# Patient Record
Sex: Female | Born: 1960 | Race: Black or African American | Hispanic: No | Marital: Married | Smoking: Never smoker
Health system: Southern US, Community
[De-identification: ages and names within clinical notes are randomized; demographics above are authoritative.]

## PROBLEM LIST (undated history)

## (undated) HISTORY — PX: ABDOMINAL HYSTERECTOMY: SHX81

---

## 2012-10-23 ENCOUNTER — Ambulatory Visit: Payer: Self-pay | Admitting: Sports Medicine

## 2012-10-23 ENCOUNTER — Ambulatory Visit (INDEPENDENT_AMBULATORY_CARE_PROVIDER_SITE_OTHER): Payer: BC Managed Care – PPO

## 2012-10-23 ENCOUNTER — Ambulatory Visit (INDEPENDENT_AMBULATORY_CARE_PROVIDER_SITE_OTHER): Payer: BC Managed Care – PPO | Admitting: Sports Medicine

## 2012-10-23 ENCOUNTER — Encounter: Payer: Self-pay | Admitting: Sports Medicine

## 2012-10-23 VITALS — BP 111/72 | HR 70 | Wt 215.0 lb

## 2012-10-23 DIAGNOSIS — M5416 Radiculopathy, lumbar region: Secondary | ICD-10-CM | POA: Insufficient documentation

## 2012-10-23 DIAGNOSIS — R209 Unspecified disturbances of skin sensation: Secondary | ICD-10-CM

## 2012-10-23 DIAGNOSIS — R200 Anesthesia of skin: Secondary | ICD-10-CM

## 2012-10-23 DIAGNOSIS — M6740 Ganglion, unspecified site: Secondary | ICD-10-CM

## 2012-10-23 DIAGNOSIS — M5137 Other intervertebral disc degeneration, lumbosacral region: Secondary | ICD-10-CM

## 2012-10-23 LAB — COMPREHENSIVE METABOLIC PANEL WITH GFR
AST: 26 U/L (ref 0–37)
Albumin: 4.4 g/dL (ref 3.5–5.2)
Alkaline Phosphatase: 116 U/L (ref 39–117)
BUN: 14 mg/dL (ref 6–23)
Calcium: 10.3 mg/dL (ref 8.4–10.5)
Chloride: 105 meq/L (ref 96–112)
Glucose, Bld: 84 mg/dL (ref 70–99)
Potassium: 4.2 meq/L (ref 3.5–5.3)
Sodium: 139 meq/L (ref 135–145)
Total Protein: 7.3 g/dL (ref 6.0–8.3)

## 2012-10-23 LAB — HEMOGLOBIN A1C
Hgb A1c MFr Bld: 5.3 % (ref <5.7)
Mean Plasma Glucose: 105 mg/dL (ref <117)

## 2012-10-23 LAB — CBC
HCT: 37.4 % (ref 36.0–46.0)
Hemoglobin: 12.4 g/dL (ref 12.0–15.0)
MCH: 28.4 pg (ref 26.0–34.0)
MCHC: 33.2 g/dL (ref 30.0–36.0)
MCV: 85.8 fL (ref 78.0–100.0)
Platelets: 286 10*3/uL (ref 150–400)
RBC: 4.36 MIL/uL (ref 3.87–5.11)
RDW: 12.9 % (ref 11.5–15.5)
WBC: 4.3 K/uL (ref 4.0–10.5)

## 2012-10-23 LAB — COMPREHENSIVE METABOLIC PANEL
ALT: 26 U/L (ref 0–35)
CO2: 25 mEq/L (ref 19–32)
Creat: 0.63 mg/dL (ref 0.50–1.10)
Total Bilirubin: 0.4 mg/dL (ref 0.3–1.2)

## 2012-10-23 LAB — VITAMIN B12: Vitamin B-12: 1163 pg/mL — ABNORMAL HIGH (ref 211–911)

## 2012-10-23 LAB — TSH: TSH: 1.446 u[IU]/mL (ref 0.350–4.500)

## 2012-10-23 MED ORDER — GABAPENTIN 300 MG PO CAPS: ORAL_CAPSULE | ORAL | Status: DC

## 2012-10-23 NOTE — Assessment & Plan Note (Addendum)
This likely represents peripheral neuropathy versus lumbar spinal stenosis. She also has breakdown of her transverse arch with metatarsalgia which could be contributory. She will come back for custom orthotics. X-ray of her low back. I'm going to check some blood work looking for vitamin deficiencies. I'm also checking hemoglobin A1c. Starting gabapentin at bedtime.  If no better after a month of conservative measures, we do need nerve conduction studies.

## 2012-10-23 NOTE — Assessment & Plan Note (Signed)
Nontender, no intervention needed.

## 2012-10-23 NOTE — Progress Notes (Addendum)
   Subjective:    I'm seeing this patient as a consultation for:  Katharina Caper, FNP  CC: Foot numbness  HPI: Mass on foot: Present for months, there is a small, painless, soft, movable, well-defined mass over the dorsum of the foot between the navicular and the cuneiforms.  Foot numbness: For months, does have a small amount of back pain, but no saddle anesthesia or bowel or bladder dysfunction, she localizes the numbness across all of the toes, but not the foot or leg, worse at the end of the day, no trauma. Symptoms are mild, persistent.  Past medical history, Surgical history, Family history not pertinant except as noted below, Social history, Allergies, and medications have been entered into the medical record, reviewed, and no changes needed.   Review of Systems: No headache, visual changes, nausea, vomiting, diarrhea, constipation, dizziness, abdominal pain, skin rash, fevers, chills, night sweats, weight loss, swollen lymph nodes, body aches, joint swelling, muscle aches, chest pain, shortness of breath, mood changes, visual or auditory hallucinations.   Objective:   General: Well Developed, well nourished, and in no acute distress.  Neuro/Psych: Alert and oriented x3, extra-ocular muscles intact, able to move all 4 extremities, sensation grossly intact. Skin: Warm and dry, no rashes noted.  Respiratory: Not using accessory muscles, speaking in full sentences, trachea midline.  Cardiovascular: Pulses palpable, no extremity edema. Abdomen: Does not appear distended. Right Foot: No visible erythema or swelling. Range of motion is full in all directions. There is a small ganglion cyst at the dorsum of the foot it feels to be between the navicular and the cuneiforms. Strength is 5/5 in all directions. No hallux valgus. No pes cavus or pes planus. No abnormal callus noted. No pain over the navicular prominence, or base of fifth metatarsal. No tenderness to palpation of the  calcaneal insertion of plantar fascia. No pain at the Achilles insertion. No pain over the calcaneal bursa. No pain of the retrocalcaneal bursa. No tenderness to palpation over the tarsals, metatarsals, or phalanges. No hallux rigidus or limitus. No tenderness palpation over interphalangeal joints. No pain with compression of the metatarsal heads. There is breakdown of the transverse arch with clawing of the toes. Neurovascularly intact distally.  Impression and Recommendations:   This case required medical decision making of moderate complexity.

## 2012-10-24 ENCOUNTER — Ambulatory Visit (INDEPENDENT_AMBULATORY_CARE_PROVIDER_SITE_OTHER): Payer: BC Managed Care – PPO | Admitting: Sports Medicine

## 2012-10-24 ENCOUNTER — Encounter: Payer: Self-pay | Admitting: Sports Medicine

## 2012-10-24 VITALS — BP 112/68 | HR 70 | Wt 216.0 lb

## 2012-10-24 DIAGNOSIS — M79609 Pain in unspecified limb: Secondary | ICD-10-CM

## 2012-10-24 DIAGNOSIS — R200 Anesthesia of skin: Secondary | ICD-10-CM

## 2012-10-24 DIAGNOSIS — R209 Unspecified disturbances of skin sensation: Secondary | ICD-10-CM

## 2012-10-24 LAB — VITAMIN D 25 HYDROXY (VIT D DEFICIENCY, FRACTURES): Vit D, 25-Hydroxy: 46 ng/mL (ref 30–89)

## 2012-10-24 NOTE — Assessment & Plan Note (Signed)
Custom orthotics as above, this is likely related to drop of the transverse arches. Return in 2 weeks. If continues to have pain in the metatarsal heads, as well as numbness in the toes I will likely place metatarsal pads.

## 2012-10-24 NOTE — Progress Notes (Signed)
    Patient was fitted for a : standard, cushioned, semi-rigid orthotic. The orthotic was heated and afterward the patient stood on the orthotic blank positioned on the orthotic stand. The patient was positioned in subtalar neutral position and 10 degrees of ankle dorsiflexion in a weight bearing stance. After completion of molding, a stable base was applied to the orthotic blank. The blank was ground to a stable position for weight bearing. Size:10 Base: Blue EVA Additional Posting and Padding: None The patient ambulated these, and they were very comfortable.  I spent 40 minutes with this patient, greater than 50% was face-to-face time counseling regarding the below diagnosis.   

## 2012-11-07 ENCOUNTER — Encounter: Payer: Self-pay | Admitting: Sports Medicine

## 2012-11-07 ENCOUNTER — Ambulatory Visit (INDEPENDENT_AMBULATORY_CARE_PROVIDER_SITE_OTHER): Payer: BC Managed Care – PPO | Admitting: Sports Medicine

## 2012-11-07 VITALS — BP 114/71 | HR 73 | Wt 216.0 lb

## 2012-11-07 DIAGNOSIS — R200 Anesthesia of skin: Secondary | ICD-10-CM

## 2012-11-07 DIAGNOSIS — R209 Unspecified disturbances of skin sensation: Secondary | ICD-10-CM

## 2012-11-07 NOTE — Progress Notes (Signed)
  Subjective:    CC: Followup  HPI: Kimberly Gould comes back for followup of numbness that she's had over the plantar aspect of both feet at the metatarsophalangeal joint distally. I created custom orthotics, this did not improve her symptoms at all. She does have symptoms of metatarsalgia, based on prior x-ray she also has multilevel degenerative disc disease. Symptoms are persistent, stable, symmetric.  Past medical history, Surgical history, Family history not pertinant except as noted below, Social history, Allergies, and medications have been entered into the medical record, reviewed, and no changes needed.   Review of Systems: No fevers, chills, night sweats, weight loss, chest pain, or shortness of breath.   Objective:    General: Well Developed, well nourished, and in no acute distress.  Neuro: Alert and oriented x3, extra-ocular muscles intact, sensation grossly intact.  HEENT: Normocephalic, atraumatic, pupils equal round reactive to light, neck supple, no masses, no lymphadenopathy, thyroid nonpalpable.  Skin: Warm and dry, no rashes. Cardiac: Regular rate and rhythm, no murmurs rubs or gallops, no lower extremity edema.  Respiratory: Clear to auscultation bilaterally. Not using accessory muscles, speaking in full sentences. Bilateral feet: Foot inspection and palpation reveals breakdown of the transverse arch and a drop of MT heads, there is significant tenderness to palpation under the metatarsal heads. Abnormal callous is present under the third metatarsal heads. Hammer toes are present but mild.  Impression and Recommendations:

## 2012-11-07 NOTE — Assessment & Plan Note (Addendum)
Per patient, no better with custom orthotics. Metatarsal pads placed today. Increase gabapentin. Return in one month, if no better we will further pursue the lumbar spine with MRI, and possible nerve conduction studies.  She did drop off a work injury form to be filled out.

## 2012-11-09 ENCOUNTER — Encounter: Payer: Self-pay | Admitting: Sports Medicine

## 2012-11-09 DIAGNOSIS — Z0289 Encounter for other administrative examinations: Secondary | ICD-10-CM

## 2012-11-09 NOTE — Progress Notes (Signed)
Disability forms filled out and placed in my out box. Charge sheet filled out.

## 2012-12-05 ENCOUNTER — Ambulatory Visit: Payer: BC Managed Care – PPO | Admitting: Sports Medicine

## 2018-04-04 ENCOUNTER — Ambulatory Visit (INDEPENDENT_AMBULATORY_CARE_PROVIDER_SITE_OTHER): Payer: BLUE CROSS/BLUE SHIELD

## 2018-04-04 ENCOUNTER — Encounter: Payer: Self-pay | Admitting: Sports Medicine

## 2018-04-04 ENCOUNTER — Ambulatory Visit: Payer: BLUE CROSS/BLUE SHIELD | Admitting: Sports Medicine

## 2018-04-04 DIAGNOSIS — M722 Plantar fascial fibromatosis: Secondary | ICD-10-CM | POA: Diagnosis not present

## 2018-04-04 DIAGNOSIS — M5416 Radiculopathy, lumbar region: Secondary | ICD-10-CM

## 2018-04-04 MED ORDER — GABAPENTIN 100 MG PO CAPS: ORAL_CAPSULE | ORAL | 3 refills | Status: DC

## 2018-04-04 MED ORDER — PREDNISONE 50 MG PO TABS: ORAL_TABLET | ORAL | 0 refills | Status: DC

## 2018-04-04 NOTE — Progress Notes (Signed)
Subjective:    I'm seeing this patient as a consultation for: Marylene Land, PA-C  CC: Heel pain  HPI: This is a pleasant 57 year old female, for the past several months she is had pain on the plantar aspect of her right heel, worse with weightbearing, tender to palpation.  She also has pain radiating from her buttock down to her plantar foot, numbness in all of her toes but worst in the middle 3 toes.  No bowel or bladder dysfunction, saddle numbness, constitutional symptoms.  I reviewed the past medical history, family history, social history, surgical history, and allergies today and no changes were needed.  Please see the problem list section below in epic for further details.  Past Medical History: History reviewed. No pertinent past medical history. Past Surgical History: Past Surgical History:  Procedure Laterality Date  . ABDOMINAL HYSTERECTOMY     Social History: Social History   Socioeconomic History  . Marital status: Married    Spouse name: Not on file  . Number of children: Not on file  . Years of education: Not on file  . Highest education level: Not on file  Occupational History  . Not on file  Social Needs  . Financial resource strain: Not on file  . Food insecurity:    Worry: Not on file    Inability: Not on file  . Transportation needs:    Medical: Not on file    Non-medical: Not on file  Tobacco Use  . Smoking status: Never Smoker  . Smokeless tobacco: Never Used  Substance and Sexual Activity  . Alcohol use: Never    Frequency: Never  . Drug use: Never  . Sexual activity: Not on file  Lifestyle  . Physical activity:    Days per week: Not on file    Minutes per session: Not on file  . Stress: Not on file  Relationships  . Social connections:    Talks on phone: Not on file    Gets together: Not on file    Attends religious service: Not on file    Active member of club or organization: Not on file    Attends meetings of clubs or  organizations: Not on file    Relationship status: Not on file  Other Topics Concern  . Not on file  Social History Narrative  . Not on file   Family History: No family history on file. Allergies: Allergies  Allergen Reactions  . Penicillins    Medications: See med rec.  Review of Systems: No headache, visual changes, nausea, vomiting, diarrhea, constipation, dizziness, abdominal pain, skin rash, fevers, chills, night sweats, weight loss, swollen lymph nodes, body aches, joint swelling, muscle aches, chest pain, shortness of breath, mood changes, visual or auditory hallucinations.   Objective:   General: Well Developed, well nourished, and in no acute distress.  Neuro:  Extra-ocular muscles intact, able to move all 4 extremities, sensation grossly intact.  Deep tendon reflexes tested were normal. Psych: Alert and oriented, mood congruent with affect. ENT:  Ears and nose appear unremarkable.  Hearing grossly normal. Neck: Unremarkable overall appearance, trachea midline.  No visible thyroid enlargement. Eyes: Conjunctivae and lids appear unremarkable.  Pupils equal and round. Skin: Warm and dry, no rashes noted.  Cardiovascular: Pulses palpable, no extremity edema. Back Exam:  Inspection: Unremarkable  Motion: Flexion 45 deg, Extension 45 deg, Side Bending to 45 deg bilaterally,  Rotation to 45 deg bilaterally  SLR laying: Negative  XSLR laying: Negative  Palpable  tenderness: None. FABER: negative. Sensory change: Gross sensation intact to all lumbar and sacral dermatomes.  Reflexes: 2+ at both patellar tendons, 2+ at achilles tendons, Babinski's downgoing.  Strength at foot  Plantar-flexion: 5/5 Dorsi-flexion: 5/5 Eversion: 5/5 Inversion: 5/5  Leg strength  Quad: 5/5 Hamstring: 5/5 Hip flexor: 5/5 Hip abductors: 5/5  Gait unremarkable. Right foot: No visible erythema or swelling. Range of motion is full in all directions. Strength is 5/5 in all directions. No hallux  valgus. No pes cavus or pes planus. No abnormal callus noted. No pain over the navicular prominence, or base of fifth metatarsal. Moderate tenderness to palpation of the calcaneal insertion of plantar fascia. No pain at the Achilles insertion. No pain over the calcaneal bursa. No pain of the retrocalcaneal bursa. No tenderness to palpation over the tarsals, metatarsals, or phalanges. No hallux rigidus or limitus. No tenderness palpation over interphalangeal joints. No pain with compression of the metatarsal heads. Neurovascularly intact distally.  Impression and Recommendations:   This case required medical decision making of moderate complexity.  Plantar fasciitis, right Prednisone, rehab exercises given. Return in a month, injection if no better. At some point we will build her some custom orthotics.  Right lumbar radiculitis Suspect L5 distribution. Updated lumbar spine x-rays. Home rehab exercises given, prednisone. Gabapentin low-dose at bedtime No back pain, only pain from the buttock to the plantar aspect of the foot.  Numbness and tingling in the toes. ___________________________________________ Ihor Austinhomas J. Benjamin Stainhekkekandam, M.D., ABFM., CAQSM. Primary Care and Sports Medicine [facility]  Adjunct Professor of Family Medicine  [employer]

## 2018-04-04 NOTE — Assessment & Plan Note (Signed)
Suspect L5 distribution. Updated lumbar spine x-rays. Home rehab exercises given, prednisone. Gabapentin low-dose at bedtime No back pain, only pain from the buttock to the plantar aspect of the foot.  Numbness and tingling in the toes.

## 2018-04-04 NOTE — Assessment & Plan Note (Signed)
Prednisone, rehab exercises given. Return in a month, injection if no better. At some point we will build her some custom orthotics.

## 2018-05-02 ENCOUNTER — Encounter: Payer: Self-pay | Admitting: Sports Medicine

## 2018-05-02 ENCOUNTER — Ambulatory Visit (INDEPENDENT_AMBULATORY_CARE_PROVIDER_SITE_OTHER): Payer: BLUE CROSS/BLUE SHIELD | Admitting: Sports Medicine

## 2018-05-02 DIAGNOSIS — M722 Plantar fascial fibromatosis: Secondary | ICD-10-CM

## 2018-05-02 NOTE — Assessment & Plan Note (Signed)
Persistent pain in spite of conservative measures. Injection as above. Return for custom molded orthotics. She tells me that when I push on the bottom of her foot it radiates all the way to her hip, she may not actually have much in the line of lumbar radiculitis.

## 2018-05-02 NOTE — Progress Notes (Signed)
Subjective:    CC: Heel pain  HPI: Kimberly Gould returns, we have been treating her conservatively for right plantar fasciitis.  She was endorsing pain that radiated up to her buttock from her foot, we initially suspected a radiculitis.  X-rays were negative, pain continues to be severe, persistent in the bottom aspect of the heel.  She never came for custom orthotics.  I reviewed the past medical history, family history, social history, surgical history, and allergies today and no changes were needed.  Please see the problem list section below in epic for further details.  Past Medical History: No past medical history on file. Past Surgical History: Past Surgical History:  Procedure Laterality Date  . ABDOMINAL HYSTERECTOMY     Social History: Social History   Socioeconomic History  . Marital status: Married    Spouse name: Not on file  . Number of children: Not on file  . Years of education: Not on file  . Highest education level: Not on file  Occupational History  . Not on file  Social Needs  . Financial resource strain: Not on file  . Food insecurity:    Worry: Not on file    Inability: Not on file  . Transportation needs:    Medical: Not on file    Non-medical: Not on file  Tobacco Use  . Smoking status: Never Smoker  . Smokeless tobacco: Never Used  Substance and Sexual Activity  . Alcohol use: Never    Frequency: Never  . Drug use: Never  . Sexual activity: Not on file  Lifestyle  . Physical activity:    Days per week: Not on file    Minutes per session: Not on file  . Stress: Not on file  Relationships  . Social connections:    Talks on phone: Not on file    Gets together: Not on file    Attends religious service: Not on file    Active member of club or organization: Not on file    Attends meetings of clubs or organizations: Not on file    Relationship status: Not on file  Other Topics Concern  . Not on file  Social History Narrative  . Not on file    Family History: No family history on file. Allergies: Allergies  Allergen Reactions  . Penicillins    Medications: See med rec.  Review of Systems: No fevers, chills, night sweats, weight loss, chest pain, or shortness of breath.   Objective:    General: Well Developed, well nourished, and in no acute distress.  Neuro: Alert and oriented x3, extra-ocular muscles intact, sensation grossly intact.  HEENT: Normocephalic, atraumatic, pupils equal round reactive to light, neck supple, no masses, no lymphadenopathy, thyroid nonpalpable.  Skin: Warm and dry, no rashes. Cardiac: Regular rate and rhythm, no murmurs rubs or gallops, no lower extremity edema.  Respiratory: Clear to auscultation bilaterally. Not using accessory muscles, speaking in full sentences. Right foot: No visible erythema or swelling. Range of motion is full in all directions. Strength is 5/5 in all directions. No hallux valgus. No pes cavus or pes planus. No abnormal callus noted. No pain over the navicular prominence, or base of fifth metatarsal. Severe pain at the calcaneal insertion of plantar fascia, palpating here since pain up to her buttock. No pain at the Achilles insertion. No pain over the calcaneal bursa. No pain of the retrocalcaneal bursa. No tenderness to palpation over the tarsals, metatarsals, or phalanges. No hallux rigidus or limitus. No tenderness  palpation over interphalangeal joints. No pain with compression of the metatarsal heads. Neurovascularly intact distally.  Procedure: Real-time Ultrasound Guided Injection of plantar fascia origin on the right Device: GE Logiq E  Verbal informed consent obtained.  Time-out conducted.  Noted no overlying erythema, induration, or other signs of local infection.  Skin prepped in a sterile fashion.  Local anesthesia: Topical Ethyl chloride.  With sterile technique and under real time ultrasound guidance: 1 cc Kenalog 40, 1 cc lidocaine, 1 cc  bupivacaine injected easily. Completed without difficulty  Pain immediately resolved suggesting accurate placement of the medication.  Advised to call if fevers/chills, erythema, induration, drainage, or persistent bleeding.  Images permanently stored and available for review in the ultrasound unit.  Impression: Technically successful ultrasound guided injection.  Impression and Recommendations:    Plantar fasciitis, right Persistent pain in spite of conservative measures. Injection as above. Return for custom molded orthotics. She tells me that when I push on the bottom of her foot it radiates all the way to her hip, she may not actually have much in the line of lumbar radiculitis. ___________________________________________ Ihor Austinhomas J. Benjamin Stainhekkekandam, M.D., ABFM., CAQSM. Primary Care and Sports Medicine [facility]  Adjunct Professor of Family Medicine  [employer]

## 2018-05-12 ENCOUNTER — Ambulatory Visit (INDEPENDENT_AMBULATORY_CARE_PROVIDER_SITE_OTHER): Payer: BLUE CROSS/BLUE SHIELD | Admitting: Sports Medicine

## 2018-05-12 ENCOUNTER — Encounter: Payer: Self-pay | Admitting: Sports Medicine

## 2018-05-12 DIAGNOSIS — M722 Plantar fascial fibromatosis: Secondary | ICD-10-CM

## 2018-05-12 NOTE — Progress Notes (Signed)
    Patient was fitted for a : standard, cushioned, semi-rigid orthotic. The orthotic was heated and afterward the patient stood on the orthotic blank positioned on the orthotic stand. The patient was positioned in subtalar neutral position and 10 degrees of ankle dorsiflexion in a weight bearing stance. After completion of molding, a stable base was applied to the orthotic blank. The blank was ground to a stable position for weight bearing. Size: 10 [facility]: [facility] Additional Posting and Padding: None The patient ambulated these, and they were very comfortable.  I spent 40 minutes with this patient, greater than 50% was face-to-face time counseling regarding the below diagnosis, specifically diagnosis and treatment options as well as the pathophysiology of plantar fasciitis..  ___________________________________________ Ihor Austinhomas J. Benjamin Stainhekkekandam, M.D., ABFM., CAQSM. Primary Care and Sports Medicine [facility]  Adjunct Instructor of Family Medicine  [employer]

## 2018-05-12 NOTE — Assessment & Plan Note (Signed)
Resolved after injection, I emphasized the importance of doing no barefoot walking around the house. Custom orthotics as above. Return in a month if needed.

## 2018-06-06 ENCOUNTER — Ambulatory Visit: Payer: BLUE CROSS/BLUE SHIELD | Admitting: Sports Medicine

## 2018-06-06 ENCOUNTER — Encounter: Payer: Self-pay | Admitting: Sports Medicine

## 2018-06-06 DIAGNOSIS — M722 Plantar fascial fibromatosis: Secondary | ICD-10-CM

## 2018-06-06 NOTE — Progress Notes (Signed)
  Subjective:    CC: Plantar fasciitis  HPI: Kimberly Gould returns, we have built custom orthotics, done an injection, she has avoided barefoot walking, she has done rehab exercises and returns completely pain-free.  I reviewed the past medical history, family history, social history, surgical history, and allergies today and no changes were needed.  Please see the problem list section below in epic for further details.  Past Medical History: No past medical history on file. Past Surgical History: Past Surgical History:  Procedure Laterality Date  . ABDOMINAL HYSTERECTOMY     Social History: Social History   Socioeconomic History  . Marital status: Married    Spouse name: Not on file  . Number of children: Not on file  . Years of education: Not on file  . Highest education level: Not on file  Occupational History  . Not on file  Social Needs  . Financial resource strain: Not on file  . Food insecurity:    Worry: Not on file    Inability: Not on file  . Transportation needs:    Medical: Not on file    Non-medical: Not on file  Tobacco Use  . Smoking status: Never Smoker  . Smokeless tobacco: Never Used  Substance and Sexual Activity  . Alcohol use: Never    Frequency: Never  . Drug use: Never  . Sexual activity: Not on file  Lifestyle  . Physical activity:    Days per week: Not on file    Minutes per session: Not on file  . Stress: Not on file  Relationships  . Social connections:    Talks on phone: Not on file    Gets together: Not on file    Attends religious service: Not on file    Active member of club or organization: Not on file    Attends meetings of clubs or organizations: Not on file    Relationship status: Not on file  Other Topics Concern  . Not on file  Social History Narrative  . Not on file   Family History: No family history on file. Allergies: Allergies  Allergen Reactions  . Penicillins    Medications: See med rec.  Review of Systems: No  fevers, chills, night sweats, weight loss, chest pain, or shortness of breath.   Objective:    General: Well Developed, well nourished, and in no acute distress.  Neuro: Alert and oriented x3, extra-ocular muscles intact, sensation grossly intact.  HEENT: Normocephalic, atraumatic, pupils equal round reactive to light, neck supple, no masses, no lymphadenopathy, thyroid nonpalpable.  Skin: Warm and dry, no rashes. Cardiac: Regular rate and rhythm, no murmurs rubs or gallops, no lower extremity edema.  Respiratory: Clear to auscultation bilaterally. Not using accessory muscles, speaking in full sentences.  Impression and Recommendations:    Plantar fasciitis, right Symptoms now completely resolved with custom orthotics, injection, and avoidance of barefoot walking. Return as needed. ___________________________________________ Ihor Austin. Benjamin Stain, M.D., ABFM., CAQSM. Primary Care and Sports Medicine [facility]  Adjunct Professor of Family Medicine  [employer]

## 2018-06-06 NOTE — Assessment & Plan Note (Signed)
Symptoms now completely resolved with custom orthotics, injection, and avoidance of barefoot walking. Return as needed.

## 2019-04-10 ENCOUNTER — Other Ambulatory Visit: Payer: Self-pay | Admitting: Family Medicine

## 2019-04-10 ENCOUNTER — Other Ambulatory Visit: Payer: BLUE CROSS/BLUE SHIELD

## 2019-04-10 DIAGNOSIS — R1031 Right lower quadrant pain: Secondary | ICD-10-CM

## 2019-04-11 ENCOUNTER — Other Ambulatory Visit: Payer: BC Managed Care – PPO

## 2019-04-12 ENCOUNTER — Other Ambulatory Visit: Payer: Self-pay | Admitting: Family Medicine

## 2019-04-12 DIAGNOSIS — R1084 Generalized abdominal pain: Secondary | ICD-10-CM

## 2019-04-17 ENCOUNTER — Other Ambulatory Visit: Payer: BC Managed Care – PPO

## 2019-05-01 ENCOUNTER — Ambulatory Visit
Admission: RE | Admit: 2019-05-01 | Discharge: 2019-05-01 | Disposition: A | Discharge status: Home / Self Care | Payer: BC Managed Care – PPO | Source: Ambulatory Visit | Attending: Family Medicine | Admitting: Family Medicine

## 2019-05-01 DIAGNOSIS — R1031 Right lower quadrant pain: Secondary | ICD-10-CM

## 2019-05-01 MED ORDER — IOPAMIDOL (ISOVUE-300) INJECTION 61%
125.0000 mL | Freq: Once | INTRAVENOUS | Status: AC | PRN
  Administered 2019-05-01: 125 mL via INTRAVENOUS

## 2021-11-06 IMAGING — CT CT ABD-PELV W/ CM
2 of 5 series · 13 of 46 positions shown, 15 images · IV contrast (iopamidol)
Comparison: None.

CLINICAL DATA: Right lower quadrant abdominal pain x3 weeks.
History of hysterectomy.

EXAM:
CT ABDOMEN AND PELVIS WITH CONTRAST
TECHNIQUE: Multidetector CT imaging of the abdomen and pelvis was performed
using the standard protocol following bolus administration of
intravenous contrast.
CONTRAST:  125mL [id number] IOPAMIDOL ([id number]) INJECTION 61%

[Series 2: abd pelvis 5.00 br40 s3 axial · axial · 0.66mm/px · z∈[+1265,+1705]mm · 10 of 100 slices shown, 12 images]
[im 6/100  soft-tissue]
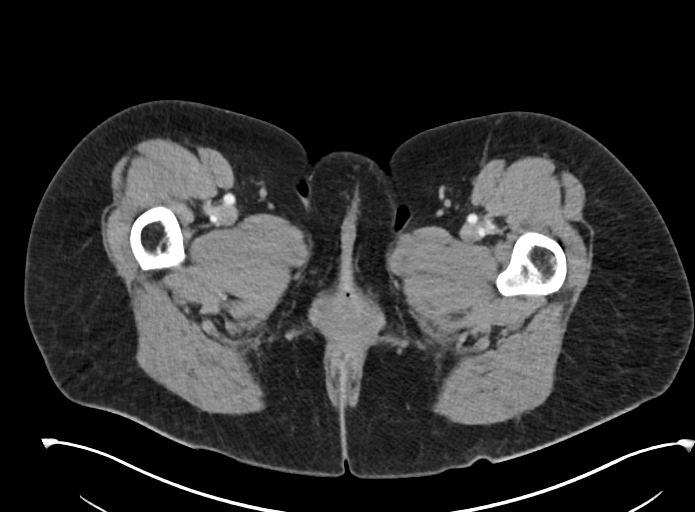
[im 6/100  bone]
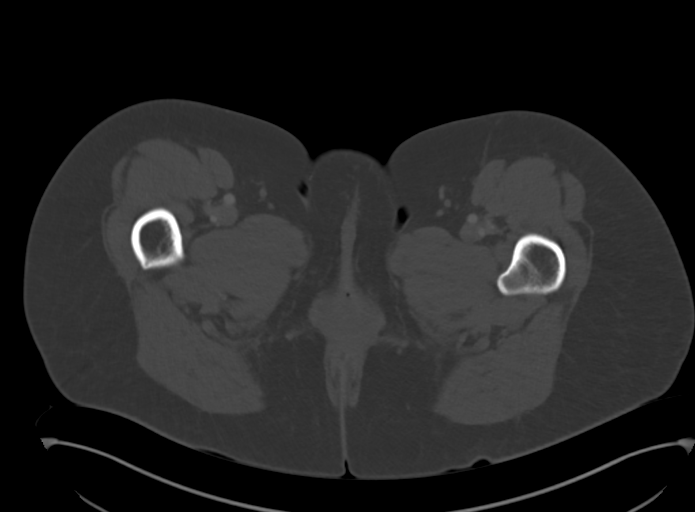
[im 17/100  soft-tissue]
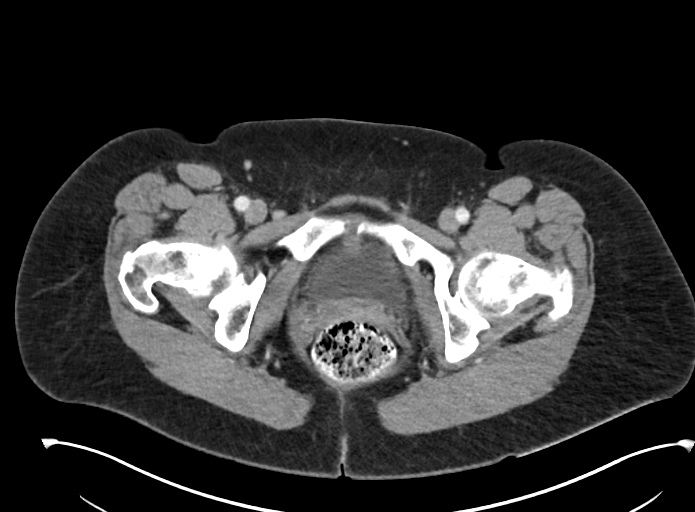
[im 28/100  soft-tissue]
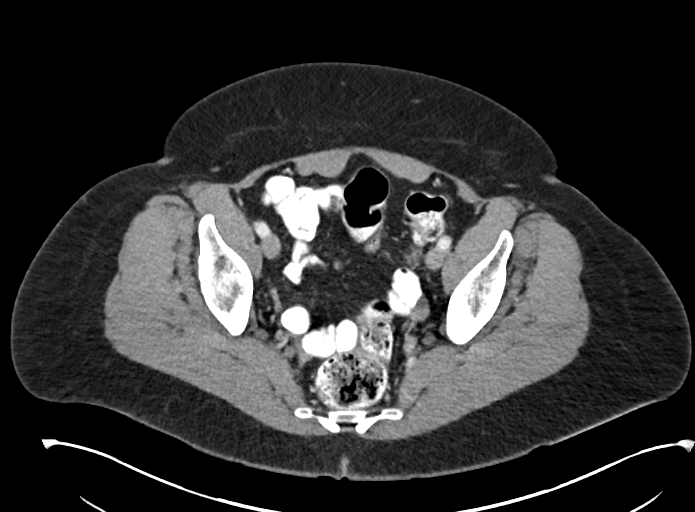
[im 34/100  soft-tissue]
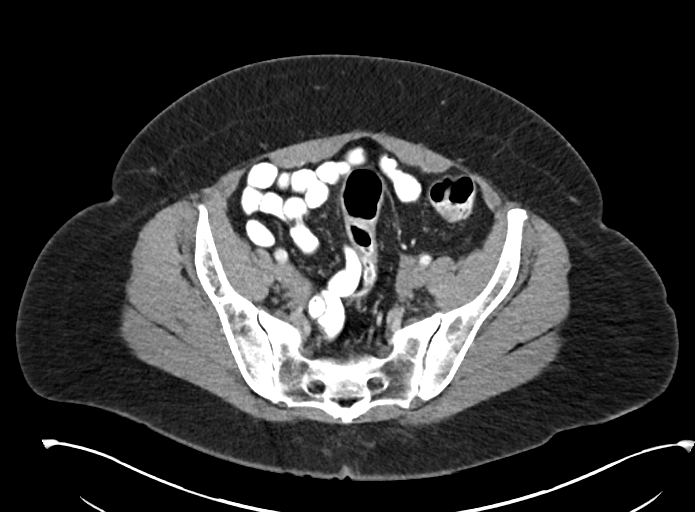
[im 45/100  soft-tissue]
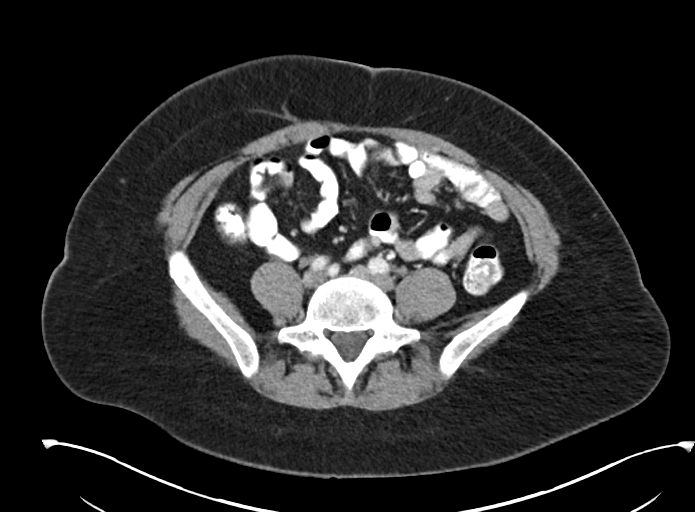
[im 56/100  soft-tissue]
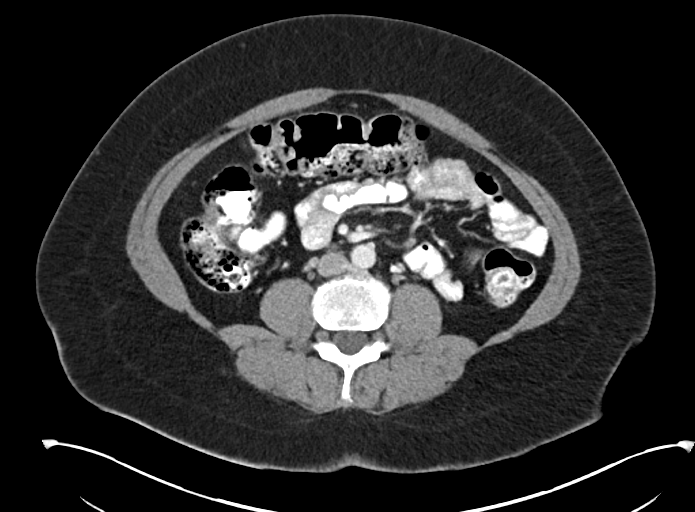
[im 67/100  soft-tissue]
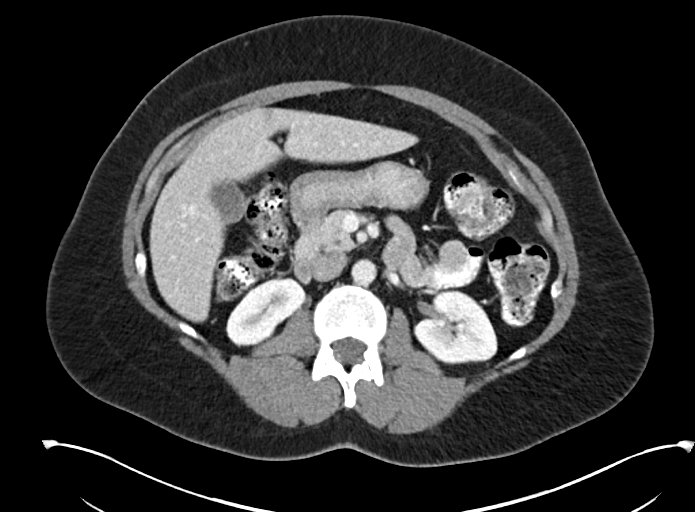
[im 72/100  soft-tissue]
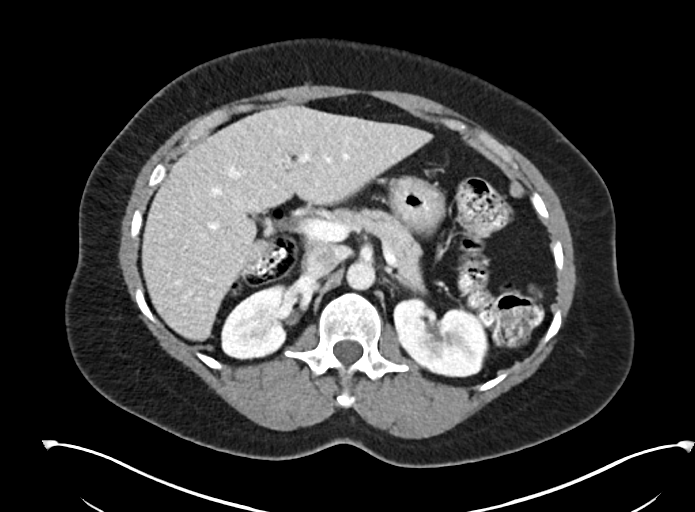
[im 83/100  soft-tissue]
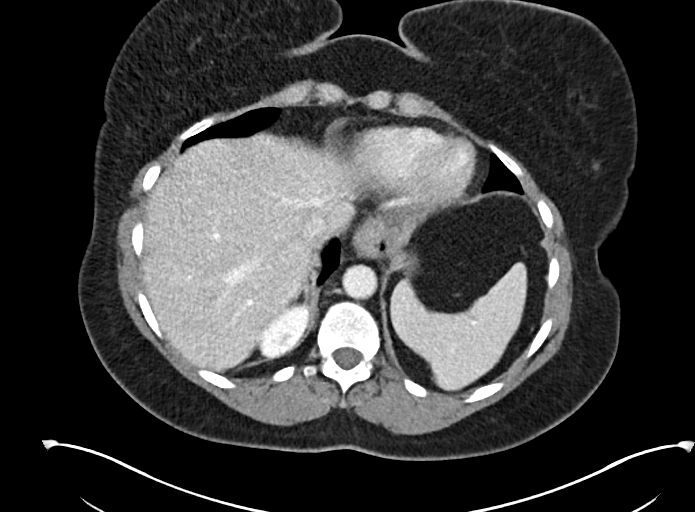
[im 83/100  bone]
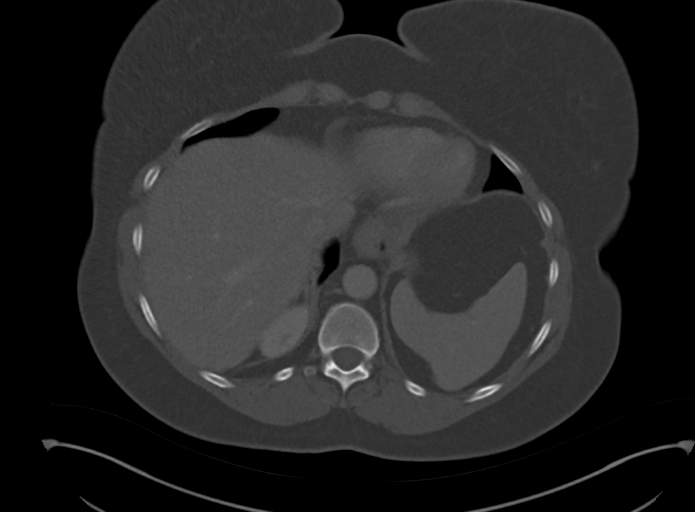
[im 94/100  soft-tissue]
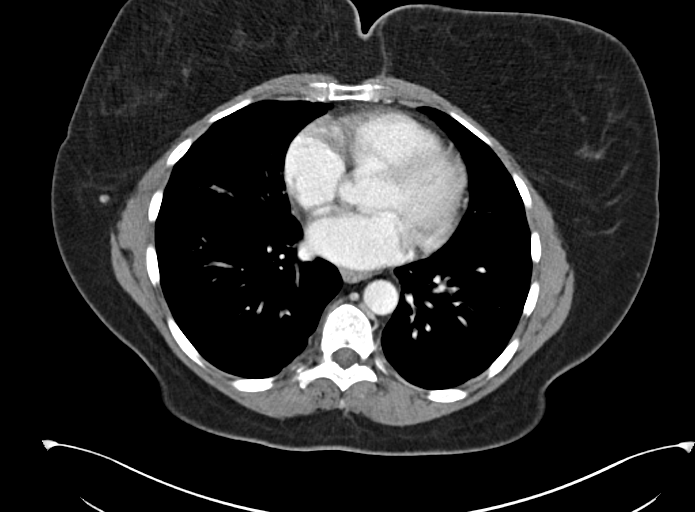

[Series 6: abd pelvis 2.00 br40 s3 cor · coronal · 0.90mm/px · 3 of 170 slices shown]
[im 57/170  soft-tissue]
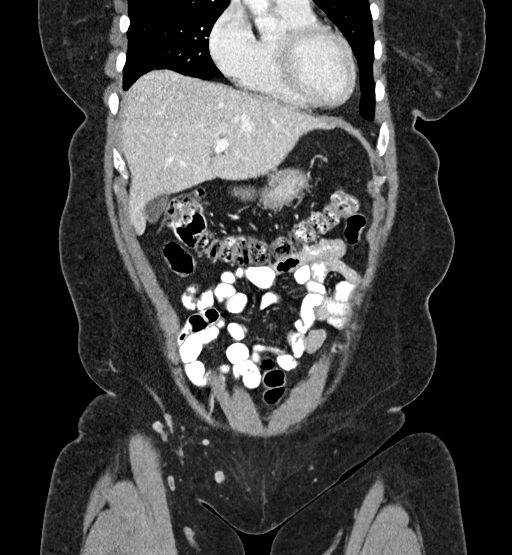
[im 76/170  soft-tissue]
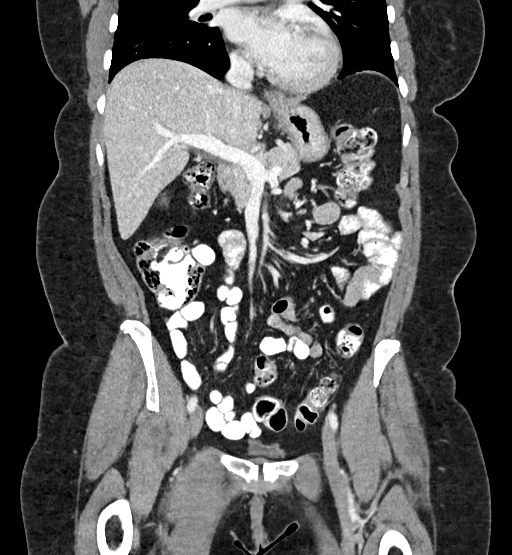
[im 94/170  soft-tissue]
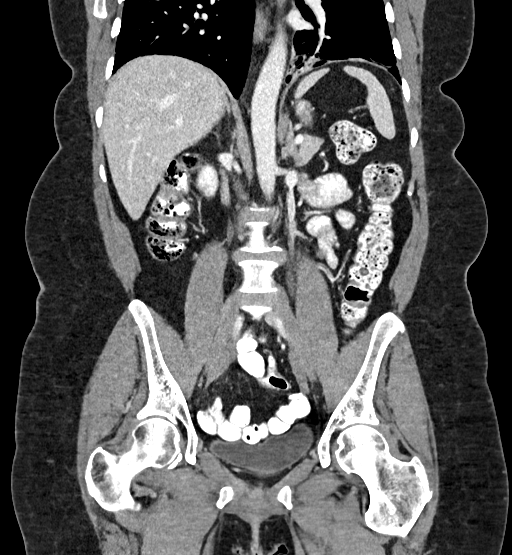

[13 of 46 positions shown; findings below may reference images not displayed]

FINDINGS: Lower chest: There is scattered ground-glass airspace opacities with
more focal consolidation at the left lung base.The heart size is
normal.

Hepatobiliary: There is likely underlying hepatic steatosis. Normal
gallbladder.There is no biliary ductal dilation.

Pancreas: Normal contours without ductal dilatation. No
peripancreatic fluid collection.

Spleen: No splenic laceration or hematoma.

Adrenals/Urinary Tract:

--Adrenal glands: No adrenal hemorrhage.

--Right kidney/ureter: No hydronephrosis or perinephric hematoma.

--Left kidney/ureter: No hydronephrosis or perinephric hematoma.

--Urinary bladder: Unremarkable.

Stomach/Bowel:

--Stomach/Duodenum: No hiatal hernia or other gastric abnormality.
Normal duodenal course and caliber.

--Small bowel: No dilatation or inflammation.

--Colon: No focal abnormality.

--Appendix: Normal.

Vascular/Lymphatic: Normal course and caliber of the major abdominal
vessels.

--No retroperitoneal lymphadenopathy.

--No mesenteric lymphadenopathy.

--No pelvic or inguinal lymphadenopathy.

Reproductive: Status post hysterectomy. No adnexal mass.

Other: No ascites or free air. There is a fat containing
periumbilical hernia.

Musculoskeletal. No acute displaced fractures.
IMPRESSION: 1. No acute intra-abdominal abnormality detected. No findings to
explain the patient's right lower quadrant pain. The appendix is
normal.
2. Incidentally noted multifocal airspace opacities at the lung
bases concerning for multifocal pneumonia (viral or bacterial).

These results will be called to the ordering clinician or
representative by the Radiologist Assistant, and communication
documented in the PACS or zVision Dashboard.

## 2023-02-17 ENCOUNTER — Ambulatory Visit: Payer: BC Managed Care – PPO

## 2023-02-17 ENCOUNTER — Ambulatory Visit: Payer: BC Managed Care – PPO | Admitting: Sports Medicine

## 2023-02-17 ENCOUNTER — Encounter: Payer: Self-pay | Admitting: Sports Medicine

## 2023-02-17 DIAGNOSIS — M19032 Primary osteoarthritis, left wrist: Secondary | ICD-10-CM

## 2023-02-17 DIAGNOSIS — M25542 Pain in joints of left hand: Secondary | ICD-10-CM

## 2023-02-17 MED ORDER — MELOXICAM 15 MG PO TABS: ORAL_TABLET | ORAL | 3 refills | Status: DC

## 2023-02-17 NOTE — Assessment & Plan Note (Signed)
This is a very pleasant 62 year old female with chronic pain left wrist, she localizes the pain at the dorsal radiocarpal joint. She does not have any pain at the thumb basal joint. She has a negative Finkelstein sign. She does have mild fusiform swelling compared to the contralateral side. Would like baseline x-rays, we will start meloxicam, home physical therapy, she can return to see me in about 4 to 6 weeks, we will do an injection if not better +/- MRI.

## 2023-02-17 NOTE — Progress Notes (Signed)
    Procedures performed today:    None.  Independent interpretation of notes and tests performed by another provider:   None.  Brief History, Exam, Impression, and Recommendations:    Primary osteoarthritis, left wrist This is a very pleasant 62 year old female with chronic pain left wrist, she localizes the pain at the dorsal radiocarpal joint. She does not have any pain at the thumb basal joint. She has a negative Finkelstein sign. She does have mild fusiform swelling compared to the contralateral side. Would like baseline x-rays, we will start meloxicam, home physical therapy, she can return to see me in about 4 to 6 weeks, we will do an injection if not better +/- MRI.  Chronic process with exacerbation and pharmacologic intervention  ____________________________________________ Ihor Austin. Benjamin Stain, M.D., ABFM., CAQSM., AME. Primary Care and Sports Medicine [facility]  Adjunct Professor of Family Medicine  [place] [employer] [facility] [employer]  Restaurant manager, fast food

## 2023-02-23 ENCOUNTER — Ambulatory Visit: Payer: BC Managed Care – PPO | Admitting: Sports Medicine

## 2023-04-05 ENCOUNTER — Ambulatory Visit: Payer: BC Managed Care – PPO | Admitting: Sports Medicine

## 2023-04-05 ENCOUNTER — Encounter: Payer: Self-pay | Admitting: Sports Medicine

## 2023-04-05 DIAGNOSIS — M19032 Primary osteoarthritis, left wrist: Secondary | ICD-10-CM | POA: Diagnosis not present

## 2023-04-05 MED ORDER — DICLOFENAC SODIUM 1 % EX GEL: 2.0000 g | Freq: Four times a day (QID) | CUTANEOUS | 11 refills | Status: DC

## 2023-04-05 NOTE — Assessment & Plan Note (Addendum)
Chronic left wrist pain, localized historically at the dorsal radiocarpal joint, all other special exam maneuvers were normal. X-rays did show some diffuse osteoarthritis. Meloxicam has helped, not interested in injections or narcotics. She is doing her home PT, adding topical Voltaren, she can return to see me as needed.

## 2023-04-05 NOTE — Progress Notes (Signed)
    Procedures performed today:    None.  Independent interpretation of notes and tests performed by another provider:   None.  Brief History, Exam, Impression, and Recommendations:    Primary osteoarthritis, left wrist Chronic left wrist pain, localized historically at the dorsal radiocarpal joint, all other special exam maneuvers were normal. X-rays did show some diffuse osteoarthritis. Meloxicam has helped, not interested in injections or narcotics. She is doing her home PT, adding topical Voltaren, she can return to see me as needed.  Chronic process with exacerbation and pharmacologic intervention  ____________________________________________ Ihor Austin. Benjamin Stain, M.D., ABFM., CAQSM., AME. Primary Care and Sports Medicine [facility]  Adjunct Professor of Family Medicine  [place] [employer] [facility] [employer]  Restaurant manager, fast food

## 2023-07-16 ENCOUNTER — Other Ambulatory Visit: Payer: Self-pay | Admitting: Sports Medicine

## 2023-07-16 DIAGNOSIS — M19032 Primary osteoarthritis, left wrist: Secondary | ICD-10-CM

## 2023-07-26 ENCOUNTER — Ambulatory Visit (INDEPENDENT_AMBULATORY_CARE_PROVIDER_SITE_OTHER): Admitting: Sports Medicine

## 2023-07-26 ENCOUNTER — Ambulatory Visit

## 2023-07-26 ENCOUNTER — Encounter: Payer: Self-pay | Admitting: Sports Medicine

## 2023-07-26 DIAGNOSIS — M722 Plantar fascial fibromatosis: Secondary | ICD-10-CM | POA: Diagnosis not present

## 2023-07-26 DIAGNOSIS — M1712 Unilateral primary osteoarthritis, left knee: Secondary | ICD-10-CM | POA: Insufficient documentation

## 2023-07-26 DIAGNOSIS — M19032 Primary osteoarthritis, left wrist: Secondary | ICD-10-CM | POA: Diagnosis not present

## 2023-07-26 DIAGNOSIS — M25562 Pain in left knee: Secondary | ICD-10-CM | POA: Diagnosis not present

## 2023-07-26 DIAGNOSIS — Z0189 Encounter for other specified special examinations: Secondary | ICD-10-CM

## 2023-07-26 MED ORDER — DICLOFENAC SODIUM 1 % EX GEL: 2.0000 g | Freq: Four times a day (QID) | CUTANEOUS | 11 refills | Status: AC

## 2023-07-26 MED ORDER — MELOXICAM 15 MG PO TABS: ORAL_TABLET | ORAL | 3 refills | Status: DC

## 2023-07-26 NOTE — Progress Notes (Signed)
    Procedures performed today:    None.  Independent interpretation of notes and tests performed by another provider:   None.  Brief History, Exam, Impression, and Recommendations:    Left knee pain This pleasant 63 year old female 2 weeks ago excellently bumped into a metal cart, it impacted the anterior aspect of her left knee, she had immediate pain, swelling. Since then she has continued to have pain that she localizes anteriorly, she has moderate swelling, no mechanical symptoms. On exam she has a palpable effusion, no discrete areas of tenderness to palpation, good motion, good strength, ligaments are stable. Suspect this contusion resulted in exacerbation of her underlying arthritis, we will start conservatively with x-rays, meloxicam, physical therapy, if not better in 4 to 6 weeks we will consider aspiration and injection.  Bilateral plantar fasciitis History of right plantar fasciitis successfully treated with custom orthotics, avoidance of barefoot walking and injection back in 2020. Now having pain left heel plantar aspect. On exam she does have tenderness at the calcaneal origin of the plantar fascia. We will get her back with custom orthotics, meloxicam, formal physical therapy. Return to see me in 6 weeks, injection if not better.    ____________________________________________ Ihor Austin. Benjamin Stain, M.D., ABFM., CAQSM., AME. Primary Care and Sports Medicine [facility]  Adjunct Professor of Family Medicine  [place] [employer] [facility] [employer]  Restaurant manager, fast food

## 2023-07-26 NOTE — Assessment & Plan Note (Signed)
 History of right plantar fasciitis successfully treated with custom orthotics, avoidance of barefoot walking and injection back in 2020. Now having pain left heel plantar aspect. On exam she does have tenderness at the calcaneal origin of the plantar fascia. We will get her back with custom orthotics, meloxicam, formal physical therapy. Return to see me in 6 weeks, injection if not better.

## 2023-07-26 NOTE — Assessment & Plan Note (Signed)
 This pleasant 63 year old female 2 weeks ago excellently bumped into a metal cart, it impacted the anterior aspect of her left knee, she had immediate pain, swelling. Since then she has continued to have pain that she localizes anteriorly, she has moderate swelling, no mechanical symptoms. On exam she has a palpable effusion, no discrete areas of tenderness to palpation, good motion, good strength, ligaments are stable. Suspect this contusion resulted in exacerbation of her underlying arthritis, we will start conservatively with x-rays, meloxicam, physical therapy, if not better in 4 to 6 weeks we will consider aspiration and injection.

## 2023-07-27 ENCOUNTER — Other Ambulatory Visit (HOSPITAL_COMMUNITY): Payer: Self-pay

## 2023-07-27 ENCOUNTER — Telehealth: Payer: Self-pay

## 2023-07-27 NOTE — Telephone Encounter (Signed)
 Pharmacy Patient Advocate Encounter  Received notification from [facility] that Prior Authorization for Diclofenac gel 1% has been APPROVED from 07/27/23 to 07/27/26. Because the medication is OTC, the plan will not pay. Pt price is $22.55. May be less expensive OTC   PA #/Case ID/Reference #: [id number]

## 2023-07-27 NOTE — Telephone Encounter (Signed)
 Pharmacy Patient Advocate Encounter   Received notification from Patient Pharmacy that prior authorization for Diclofenac gel 1% is required/requested.   Insurance verification completed.   The patient is insured through [facility] .   Per test claim: PA required; PA submitted to above mentioned insurance via CoverMyMeds Key/confirmation #/EOC [id number] Status is pending

## 2023-08-02 ENCOUNTER — Ambulatory Visit: Admitting: Rehabilitative and Restorative Service Providers"

## 2023-08-02 ENCOUNTER — Encounter: Payer: Self-pay | Admitting: Rehabilitative and Restorative Service Providers"

## 2023-08-02 ENCOUNTER — Other Ambulatory Visit: Payer: Self-pay

## 2023-08-02 ENCOUNTER — Ambulatory Visit: Attending: Sports Medicine | Admitting: Rehabilitative and Restorative Service Providers"

## 2023-08-02 DIAGNOSIS — M722 Plantar fascial fibromatosis: Secondary | ICD-10-CM | POA: Diagnosis present

## 2023-08-02 DIAGNOSIS — M25562 Pain in left knee: Secondary | ICD-10-CM | POA: Insufficient documentation

## 2023-08-02 DIAGNOSIS — R29898 Other symptoms and signs involving the musculoskeletal system: Secondary | ICD-10-CM | POA: Diagnosis present

## 2023-08-02 NOTE — Therapy (Signed)
 OUTPATIENT PHYSICAL THERAPY LOWER EXTREMITY EVALUATION   Patient Name: Kimberly Gould MRN: [id number] DOB:31-Dec-1960, 63 y.o., female Today's Date: 08/02/2023  END OF SESSION:  PT End of Session - 08/02/23 1549     Visit Number 1    Number of Visits 4    Date for PT Re-Evaluation 08/30/23    Authorization Type state plan    PT Start Time 1348    PT Stop Time 1435    PT Time Calculation (min) 47 min    Activity Tolerance Patient tolerated treatment well             History reviewed. No pertinent past medical history. Past Surgical History:  Procedure Laterality Date   ABDOMINAL HYSTERECTOMY     Patient Active Problem List   Diagnosis Date Noted   Left knee pain 07/26/2023   Primary osteoarthritis, left wrist 02/17/2023   Bilateral plantar fasciitis 04/04/2018   Ganglion cyst 10/23/2012   Right lumbar radiculitis 10/23/2012    PCP: Dr Rodney Langton  REFERRING PROVIDER: Dr Rodney Langton  REFERRING DIAG: Acute pain L knee; L plantar fasciitis   THERAPY DIAG:  Acute pain of left knee  Plantar fasciitis of left foot  Other symptoms and signs involving the musculoskeletal system  Rationale for Evaluation and Treatment: Rehabilitation  ONSET DATE: 06/29/23  SUBJECTIVE:   SUBJECTIVE STATEMENT: Patient struck L knee on a metal cart ~ 4-5 weeks ago. She had no pain until 2-3 weeks ago. She had swelling in L knee and started having pain in the L knee and tightness in the back of the L knee. She has intermittent pain in the L knee.    PERTINENT HISTORY: L wrist; arthritis; HTN PAIN:  Are you having pain? Yes: NPRS scale: 0/10 Pain location: L knee - knee cap is fine  Pain description: tight behind L knee  Aggravating factors: nothing  Relieving factors: nothing   PRECAUTIONS: None  RED FLAGS: None   WEIGHT BEARING RESTRICTIONS: No  FALLS:  Has patient fallen in last 6 months? 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  DIAGNOSTIC FINDINGS: arthritis L knee medial compartment   COGNITION: Overall cognitive status: Within functional limits for tasks assessed     SENSATION: WFL  EDEMA:  Minimal L knee  MUSCLE LENGTH: Hamstrings: Right 75 deg; Left 70 deg  POSTURE: rounded shoulders and forward head  PALPATION: tightness posterior knee   LOWER EXTREMITY ROM: tight end ranges L hip but WFL's    LOWER EXTREMITY MMT: WNL's   GAIT: Distance walked: 40 ft Assistive device utilized: None Level of assistance: Complete Independence Comments: WFL's  TREATMENT DATE: 08/02/23    PATIENT EDUCATION:  Education details: POC; HEP  Person educated: Patient Education method: Programmer, multimedia, Demonstration, Actor cues, Verbal cues, and Handouts Education comprehension: verbalized understanding, returned demonstration, verbal cues required, tactile cues required, and needs further education  HOME EXERCISE PROGRAM: Access Code: [email or web address] URL: [email or web address] Date: 08/02/2023 Prepared by: Corlis Leak  Exercises - Supine Piriformis Stretch with Leg Straight  - 2 x daily - 7 x weekly - 1 sets - 3 reps - 30 sec  hold - Hooklying Hamstring Stretch with Strap  - 2 x daily - 7 x weekly - 1 sets - 3 reps - 30 sec  hold - Hip Adductors and Hamstring Stretch with Strap  - 2 x daily - 7 x weekly - 1 sets - 3 reps - 30  sec  hold - Supine ITB Stretch with Strap  - 2 x daily - 7 x weekly - 1 sets - 3 reps - 30 sec  hold - Supine Hip Adductor Stretch  - 2 x daily - 7 x weekly - 1 sets - 3 reps - 30 sec  hold - Prone Quadriceps Stretch with Strap  - 2 x daily - 7 x weekly - 1 sets - 3 reps - 30 sec  hold - Gastroc Stretch on Wall  - 2 x daily - 7 x weekly - 1 sets - 3 reps - 30 sec  hold - Soleus Stretch on Wall  - 2 x daily - 7 x weekly - 1 sets - 3 reps - 30 sec  hold - Toe Yoga - Alternating Great Toe and Lesser Toe Extension  - 2 x daily - 7 x weekly - 1 sets - 10 reps - 3 sec  hold - Seated Toe Towel Scrunches  - 2 x daily - 7 x weekly  ASSESSMENT:  CLINICAL IMPRESSION: Patient is a 63 y.o. female who was seen today for physical therapy evaluation and treatment for L knee pain and tightness. Patient struck a metal cart with her L knee ~ 5 weeks ago and noted swelling in the knee but no pain or tightness for a couple of weeks. She has had tightness in the posterior knee for the past 2-3 weeks but no pain. Patient presents with tightness at end ranges L hip compared to R; hamstring and quad tightness; plantar pain. Patient will benefit form PT to address problems identified.    OBJECTIVE IMPAIRMENTS: decreased activity tolerance, decreased mobility, and decreased ROM.   ACTIVITY LIMITATIONS: standing and locomotion level  PARTICIPATION LIMITATIONS: occupation  PERSONAL FACTORS: Time since onset of injury/illness/exacerbation are also affecting patient's functional outcome.   REHAB POTENTIAL: Good  CLINICAL DECISION MAKING: Evolving/moderate complexity  EVALUATION COMPLEXITY: Moderate   GOALS: Goals reviewed with patient? Yes  SHORT TERM GOALS: Target date: 08/02/23 Independent in initial HEP  Baseline: Goal status: accomplished    LONG TERM GOALS: Target date: 08/30/2023   Progress exercise program as patient requires. She will call to schedule additional appointments if needed  Baseline:   Goal status: INITIAL  2.  Independent in HEP  Baseline:  Goal status: INITIAL    PLAN:  PT FREQUENCY: one visit; patient will call to schedule additional appointments as needed   PT DURATION: 4 weeks  PLANNED INTERVENTIONS: 97110-Therapeutic exercises, 97530- Therapeutic activity, 97112- Neuromuscular re-education, 97535- Self Care, 16109- Manual therapy, [phone]- Gait training, Balance training, Stair training, Taping, Joint mobilization, Cryotherapy, and Moist heat  PLAN FOR NEXT SESSION: review and progress exercises  as indicated; patient will call to schedule additional appointments as needed    Selwyn Reason Rober Minion, PT 08/02/2023, 3:50 PM

## 2023-08-03 ENCOUNTER — Ambulatory Visit: Admitting: Sports Medicine

## 2023-09-06 ENCOUNTER — Other Ambulatory Visit (INDEPENDENT_AMBULATORY_CARE_PROVIDER_SITE_OTHER)

## 2023-09-06 ENCOUNTER — Ambulatory Visit (INDEPENDENT_AMBULATORY_CARE_PROVIDER_SITE_OTHER): Admitting: Sports Medicine

## 2023-09-06 DIAGNOSIS — M722 Plantar fascial fibromatosis: Secondary | ICD-10-CM

## 2023-09-06 DIAGNOSIS — M1712 Unilateral primary osteoarthritis, left knee: Secondary | ICD-10-CM | POA: Diagnosis not present

## 2023-09-06 MED ORDER — TRIAMCINOLONE ACETONIDE 40 MG/ML IJ SUSP
40.0000 mg | Freq: Once | INTRAMUSCULAR | Status: AC
  Administered 2023-09-06: 40 mg via INTRAMUSCULAR

## 2023-09-06 MED ORDER — MELOXICAM 15 MG PO TABS: ORAL_TABLET | ORAL | 3 refills | Status: AC

## 2023-09-06 NOTE — Progress Notes (Signed)
    Procedures performed today:    Procedure: Real-time Ultrasound Guided injection of the left plantar fascial origin Device: Samsung HS60  Verbal informed consent obtained.  Time-out conducted.  Noted no overlying erythema, induration, or other signs of local infection.  Skin prepped in a sterile fashion.  Local anesthesia: Topical Ethyl chloride.  With sterile technique and under real time ultrasound guidance: Thickened plantar fascia noted, 1 cc Kenalog  40, 2 cc lidocaine, 2 cc bupivacaine injected easily Completed without difficulty  Advised to call if fevers/chills, erythema, induration, drainage, or persistent bleeding.  Images permanently stored and available for review in PACS.  Impression: Technically successful ultrasound guided injection.  Independent interpretation of notes and tests performed by another provider:   None.  Brief History, Exam, Impression, and Recommendations:    Bilateral plantar fasciitis This is a pleasant 63 year old female, history of right plantar fasciitis in the past treated with custom orthotics, avoidance of barefoot walking and injection back in 2020. More recently she started having pain plantar aspect calcaneal origin of the plantar fascia left foot. We had initially recommended orthotics, meloxicam , PT, unfortunately the above was not successful so today we proceeded with ultrasound-guided injection at the plantar fascial origin. We will put her in a boot for about a week, and then she will continue home conditioning, return to see me 6 weeks. Refilling meloxicam   Primary osteoarthritis of left knee Arthritis noted on x-rays, if this has further was heart resolved with meloxicam  and PT.    ____________________________________________ Joselyn Nicely. Sandy Crumb, M.D., ABFM., CAQSM., AME. Primary Care and Sports Medicine [facility]  Adjunct Professor of [facility] Medicine  [employer]  [employer]  Restaurant manager, fast food

## 2023-09-06 NOTE — Addendum Note (Signed)
 Addended by: Montgomery Apgar on: 09/06/2023 03:21 PM   Modules accepted: Orders

## 2023-09-06 NOTE — Assessment & Plan Note (Signed)
 This is a pleasant 64 year old female, history of right plantar fasciitis in the past treated with custom orthotics, avoidance of barefoot walking and injection back in 2020. More recently she started having pain plantar aspect calcaneal origin of the plantar fascia left foot. We had initially recommended orthotics, meloxicam , PT, unfortunately the above was not successful so today we proceeded with ultrasound-guided injection at the plantar fascial origin. We will put her in a boot for about a week, and then she will continue home conditioning, return to see me 6 weeks. Refilling meloxicam 

## 2023-09-06 NOTE — Assessment & Plan Note (Signed)
 Arthritis noted on x-rays, if this has further was heart resolved with meloxicam  and PT.

## 2023-10-18 ENCOUNTER — Ambulatory Visit (INDEPENDENT_AMBULATORY_CARE_PROVIDER_SITE_OTHER): Admitting: Sports Medicine

## 2023-10-18 ENCOUNTER — Encounter: Payer: Self-pay | Admitting: Sports Medicine

## 2023-10-18 ENCOUNTER — Ambulatory Visit

## 2023-10-18 DIAGNOSIS — M25572 Pain in left ankle and joints of left foot: Secondary | ICD-10-CM

## 2023-10-18 DIAGNOSIS — G8929 Other chronic pain: Secondary | ICD-10-CM

## 2023-10-18 DIAGNOSIS — M722 Plantar fascial fibromatosis: Secondary | ICD-10-CM | POA: Diagnosis not present

## 2023-10-18 NOTE — Patient Instructions (Signed)
 SABRA

## 2023-10-18 NOTE — Assessment & Plan Note (Signed)
 Plantar fasciitis now resolved after injection at the last visit.

## 2023-10-18 NOTE — Progress Notes (Signed)
    Procedures performed today:    None.  Independent interpretation of notes and tests performed by another provider:   None.  Brief History, Exam, Impression, and Recommendations:    Chronic pain of left ankle Very pleasant 63 year old female, increasing pain in medial ankle, somewhat over the tibialis posterior and behind the medial malleolus, no reproduction of pain with resisted flexion of the great toe or the small toes, minimal reproduction with resisted inversion. Question tibialis posterior tendinopathy. She does have a boot but is not allowed to wear it at work since there is not a steel toe, we will add a steel toe addition to her boot so that it will meet her work criteria. Adding tibialis posterior home physical therapy, x-rays, MRI due to chronicity of pain, return to see me 6 weeks.  Bilateral plantar fasciitis Plantar fasciitis now resolved after injection at the last visit.    ____________________________________________ Debby PARAS. Curtis, M.D., ABFM., CAQSM., AME. Primary Care and Sports Medicine [facility]  Adjunct Professor of [facility] Medicine  [employer]  Restaurant manager, fast food

## 2023-10-18 NOTE — Assessment & Plan Note (Signed)
 Very pleasant 63 year old female, increasing pain in medial ankle, somewhat over the tibialis posterior and behind the medial malleolus, no reproduction of pain with resisted flexion of the great toe or the small toes, minimal reproduction with resisted inversion. Question tibialis posterior tendinopathy. She does have a boot but is not allowed to wear it at work since there is not a steel toe, we will add a steel toe addition to her boot so that it will meet her work criteria. Adding tibialis posterior home physical therapy, x-rays, MRI due to chronicity of pain, return to see me 6 weeks.

## 2023-10-22 ENCOUNTER — Ambulatory Visit

## 2023-10-22 DIAGNOSIS — M25572 Pain in left ankle and joints of left foot: Secondary | ICD-10-CM

## 2023-10-22 DIAGNOSIS — G8929 Other chronic pain: Secondary | ICD-10-CM

## 2023-10-23 ENCOUNTER — Ambulatory Visit: Payer: Self-pay | Admitting: Sports Medicine

## 2023-10-25 ENCOUNTER — Telehealth: Payer: Self-pay | Admitting: Sports Medicine

## 2023-10-25 NOTE — Telephone Encounter (Signed)
 Copied from CRM [id number]. Topic: General - Call Back - No Documentation >> Oct 25, 2023 12:42 PM Laurier C wrote: Reason for CRM: Patient was returning a missed call she had and would like someone to give her a call back at [phone]

## 2023-10-25 NOTE — Telephone Encounter (Signed)
 Attempte to reach pt by phone regarding imaging results. VM full, unable to leave message.

## 2023-11-29 ENCOUNTER — Ambulatory Visit: Admitting: Sports Medicine

## 2023-12-29 ENCOUNTER — Encounter: Payer: Self-pay | Admitting: Sports Medicine
